# Patient Record
Sex: Male | Born: 1970 | Hispanic: No | Marital: Single | Smoking: Former smoker
Health system: Southern US, Community
[De-identification: ages and names within clinical notes are randomized; demographics above are authoritative.]

## PROBLEM LIST (undated history)

## (undated) DIAGNOSIS — I10 Essential (primary) hypertension: Secondary | ICD-10-CM

## (undated) DIAGNOSIS — M109 Gout, unspecified: Secondary | ICD-10-CM

## (undated) DIAGNOSIS — I861 Scrotal varices: Secondary | ICD-10-CM

## (undated) HISTORY — DX: Gout, unspecified: M10.9

## (undated) HISTORY — DX: Essential (primary) hypertension: I10

---

## 1998-03-15 ENCOUNTER — Encounter: Admission: RE | Admit: 1998-03-15 | Discharge: 1998-06-13 | Payer: Self-pay

## 1998-05-20 ENCOUNTER — Ambulatory Visit (HOSPITAL_COMMUNITY): Admission: RE | Admit: 1998-05-20 | Discharge: 1998-05-20 | Payer: Self-pay | Admitting: *Deleted

## 2004-01-20 ENCOUNTER — Ambulatory Visit (HOSPITAL_BASED_OUTPATIENT_CLINIC_OR_DEPARTMENT_OTHER): Admission: RE | Admit: 2004-01-20 | Discharge: 2004-01-20 | Payer: Self-pay | Admitting: Oral Surgery

## 2004-02-02 ENCOUNTER — Encounter: Admission: RE | Admit: 2004-02-02 | Discharge: 2004-02-02 | Payer: Self-pay | Admitting: Internal Medicine

## 2009-06-05 ENCOUNTER — Emergency Department (HOSPITAL_COMMUNITY): Admission: EM | Admit: 2009-06-05 | Discharge: 2009-06-06 | Payer: Self-pay | Admitting: Internal Medicine

## 2010-05-19 NOTE — Op Note (Signed)
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   PROCEDURE:  Surgical removal of full bony supernumerary #1-S, surgical  extraction of tooth #1, full bony impaction tooth #16, and full bony  impaction tooth #17.   ANESTHESIA:  General.   SURGEON:  Hinton Dyer, D.D.S.   ASSISTANTS:  Angelia Mould and Montel Culver.   ESTIMATED BLOOD LOSS:  Less than 100 mL.   CONDITION AT END OF SURGERY:  Good.   DESCRIPTION OF PROCEDURE:  Following preoperative medication, the patient  was brought and placed in the supine position which he remained throughout  the whole procedure. He was intubated by right nasal endotracheal tube and  prepped and draped in the usual fashion for an enteral procedure. The throat  was suctioned out dry, and a moist open 4 x 4 gauze was placed around the  endotracheal tube. Two percent Xylocaine with 1:100,000 epinephrine was  given as a block on the left side and infiltration around the impacted  wisdom tooth. A second carpule was given in the mucobuccal fold and palate,  and a third carpule was given on the right side in the mucobuccal fold and  palate. Starting in the upper left side, a 15 blade was used to make an  incision over the left tuberosity with a full thickness envelope flap  created around teeth numbers 14 and 15. A child-size bite block was also  used. A periosteal elevator reflected a full thickness flap around the left  tuberosity and the bone covering the  horizontal impaction tooth #16 was now  removed with a round bur and copious irrigation. As the bone was removed  with the round bur and the tooth was exposed, attempts were made to mobilize  it. It was found not to move at all, and bone was continually removed with  the round bur and copious irrigation. Very slowly, the tooth began to be  mobilized with an 11A elevator with great difficulty, and the crown was then  removed using the same round bur. A small purchase point was placed into the  root, and through gentle luxation back and forth, a crane pick was used to  ultimately remove the large bulbous thick roots that were in a horizontal  fashion. The roots of tooth #15 were never seen. The socket was irrigated  out copiously, any bone fragments removed. The area was checked for any  sharp areas, and the soft tissue was repositioned and closed with multiple 3-  0 chromic sutures. A 15 blade was then used to make an incision over the  left  retromolar pad with a distal buckle release on the distal buckle aspect  of tooth #18 and a full thickness mucoperiosteal flap was elevated.  Occlusive buckle and distal bone was removed with a round bur and copious  irrigation, and the tooth was luxated until it finally became mobile and was  removed. Large bulbous roots were again seen. The tooth was extremely  difficult to be removed but ultimately was able to be lifted out using an  11A elevator and a crane pick. The socket was curetted, irrigated and closed  with multiple 3-0 chromic sutures. An abrasion on the lower left lip was  closed with two 4-0 chromic sutures. It was minimal in nature but felt it  would heal better if it were closed primarily. Attention was then turned to  the upper right side. Tooth #1 was erupted, and creating a large ulcer in  the mucobuccal fold, tooth #1-S or supernumerary (#51) was above it. The  tooth was luxated and mobilized with an 11A elevator and finally removed.   The palatal root fractured off and had to be teased out using root picks.  Bone was removed until the crown was removed until the supernumerary could  be visualized, and then it was mobilized with great difficulty with an 11A  elevator. It finally began to move and was removed in a distal buckle  fashion with the 11A elevator. Loose fragments of bone were removed with a  rongeur, and areas were checked for any sharp protruding areas. The area was  irrigated copiously, and multiple 3-0 chromic sutures were used to close the  soft tissues. The throat pack was removed. The 4 x 4 gauze was placed around  the surgical site. Vaseline was placed on the lips. He was extubated on the  table and returned to the recovery room in good condition. He is being given  a prescription for Keflex 500 mg x28 and Percocet x30 one or two q.4h.  p.r.n. pain. He will be followed by me closely in my private office, return  visit one-week observation.       JLM/MEDQ  D:  01/20/2004  T:  01/20/2004  Job:  [id number]

## 2013-12-06 ENCOUNTER — Encounter: Payer: Self-pay | Admitting: *Deleted

## 2014-12-30 ENCOUNTER — Ambulatory Visit
Admission: RE | Admit: 2014-12-30 | Discharge: 2014-12-30 | Disposition: A | Discharge status: Home / Self Care | Payer: Worker's Compensation | Source: Ambulatory Visit | Attending: Nurse Practitioner | Admitting: Nurse Practitioner

## 2014-12-30 ENCOUNTER — Other Ambulatory Visit: Payer: Self-pay | Admitting: Nurse Practitioner

## 2014-12-30 DIAGNOSIS — R52 Pain, unspecified: Secondary | ICD-10-CM

## 2014-12-30 DIAGNOSIS — R609 Edema, unspecified: Secondary | ICD-10-CM

## 2015-05-25 ENCOUNTER — Other Ambulatory Visit: Payer: Self-pay | Admitting: Urology

## 2015-05-25 DIAGNOSIS — I861 Scrotal varices: Secondary | ICD-10-CM

## 2015-06-29 ENCOUNTER — Other Ambulatory Visit: Payer: Self-pay

## 2015-09-01 ENCOUNTER — Other Ambulatory Visit: Payer: Self-pay | Admitting: Urology

## 2015-09-01 DIAGNOSIS — I861 Scrotal varices: Secondary | ICD-10-CM

## 2015-10-12 ENCOUNTER — Ambulatory Visit
Admission: RE | Admit: 2015-10-12 | Discharge: 2015-10-12 | Disposition: A | Discharge status: Home / Self Care | Payer: 59 | Source: Ambulatory Visit | Attending: Urology | Admitting: Urology

## 2015-10-12 DIAGNOSIS — I861 Scrotal varices: Secondary | ICD-10-CM

## 2015-10-12 HISTORY — DX: Scrotal varices: I86.1

## 2015-10-12 HISTORY — PX: IR GENERIC HISTORICAL: IMG1180011

## 2015-11-02 NOTE — Consult Note (Signed)
Chief Complaint: Patient was seen in consultation today for left varicocele at the request of Tannenbaum,Sigmund  Referring Physician(s): Tannenbaum,Sigmund  History of Present Illness: Kenneth Bentley is a 45 y.o. male with a history of left testicular varicocele. He describes an aching pain of the left scrotum that lasts up to several hours. He feels that this discomfort has been worse since vasectomy in January. Discomfort is not constant and not necessarily associated with strenuous activity or exercise.  Past Medical History:  Diagnosis Date  . Hypertension   . Left varicocele     No past surgical history on file.  Allergies: Vicodin [hydrocodone-acetaminophen]  Medications: Prior to Admission medications   Medication Sig Start Date End Date Taking? Authorizing Provider  anastrozole (ARIMIDEX) 1 MG tablet Take 1 mg by mouth daily.   Yes Historical Provider, MD  clomiPHENE (CLOMID) 50 MG tablet Take 50 mg by mouth daily.   Yes Historical Provider, MD  colchicine 0.6 MG tablet Take 0.6 mg by mouth daily. Take 2 tablets x 1, take 1 tablet 1 hour after, every 3 days as needed for gout attacks.   Yes Historical Provider, MD  losartan (COZAAR) 50 MG tablet Take 50 mg by mouth daily.   Yes Historical Provider, MD  Multiple Vitamin (MULTIVITAMIN) tablet Take 1 tablet by mouth daily.   Yes Historical Provider, MD  hydrochlorothiazide (MICROZIDE) 12.5 MG capsule Take 12.5 mg by mouth daily.    Historical Provider, MD     No family history on file.  Social History   Social History  . Marital status: Single    Spouse name: N/A  . Number of children: N/A  . Years of education: N/A   Social History Main Topics  . Smoking status: Former Research scientist (life sciences)  . Smokeless tobacco: Never Used  . Alcohol use Yes     Comment: Occasional beer or glass of wine    . Drug use: No  . Sexual activity: Not on file   Other Topics Concern  . Not on file   Social History Narrative  . No  narrative on file     Review of Systems: A 12 point ROS discussed and pertinent positives are indicated in the HPI above.  All other systems are negative.  Review of Systems  Constitutional: Negative.   Respiratory: Negative.   Cardiovascular: Negative.   Gastrointestinal: Negative.   Genitourinary: Negative.   Musculoskeletal: Negative.   Neurological: Negative.     Vital Signs: BP (!) 141/88 (BP Location: Left Arm, Patient Position: Sitting, Cuff Size: Large)   Pulse 89   Temp 98.1 F (36.7 C) (Oral)   Resp 14   Ht _0  (1.803 m)   Wt 230 lb (104.3 kg)   SpO2 100%   BMI 32.08 kg/m   Physical Exam  Constitutional: He appears well-developed and well-nourished. No distress.  HENT:  Head: Normocephalic and atraumatic.  Neck: Neck supple. No JVD present. No tracheal deviation present. No thyromegaly present.  Cardiovascular: Normal rate, regular rhythm and normal heart sounds.  Exam reveals no friction rub.   No murmur heard. Pulmonary/Chest: Effort normal and breath sounds normal. No stridor. No respiratory distress. He has no wheezes. He has no rales.  Abdominal: Soft. Bowel sounds are normal. He exhibits no distension. There is no tenderness.  Genitourinary:  Genitourinary Comments: Scrotal exam demonstrates a palpable roughly 2+ left varicocele with Valsalva maneuver. The left testis is approximately 20-25 percent smaller in volume compared to the right by  palpation. No palpable right-sided varicocele. No palpable scrotal or testicular masses.  Skin: He is not diaphoretic.  Nursing note and vitals reviewed.    Imaging: No results found.   Assessment and Plan:  I met with Kenneth Bentley. We reviewed options for treatment of symptomatic left varicocele including transcatheter embolization of the testicular vein and surgical ligation. We reviewed a prior scrotal ultrasound performed at [facility] on 05/06/2015 that demonstrates normal testicles bilaterally, trace  hydroceles and suggestion of bilateral varicocele, left greater than right. When reviewing images, the right-sided varicocele does not appear significant and a right-sided varicocele cannot be palpated on physical examination. The left varicocele is palpable.  Details of possible transcatheter embolization of the left testicular vein were discussed. After discussion, since his symptoms are not severe, he would like to think about having a procedure done and will call us to let us know if he would like to pursue embolization.  Thank you for this interesting consult.  I greatly enjoyed meeting Kenneth Bentley and look forward to participating in their care.  A copy of this report was sent to the requesting provider on this date.  Electronically SignedAletta Edouard T 11/02/2015, 2:21 PM     I spent a total of 40 Minutes in face to face in clinical consultation, greater than 50% of which was counseling/coordinating care for left testicular varicocele.

## 2015-11-22 ENCOUNTER — Encounter: Payer: Self-pay | Admitting: Interventional Radiology

## 2016-05-02 DIAGNOSIS — E291 Testicular hypofunction: Secondary | ICD-10-CM | POA: Diagnosis not present

## 2016-05-02 DIAGNOSIS — Z125 Encounter for screening for malignant neoplasm of prostate: Secondary | ICD-10-CM | POA: Diagnosis not present

## 2016-06-28 DIAGNOSIS — L910 Hypertrophic scar: Secondary | ICD-10-CM | POA: Diagnosis not present

## 2016-07-13 DIAGNOSIS — H25013 Cortical age-related cataract, bilateral: Secondary | ICD-10-CM | POA: Diagnosis not present

## 2016-07-13 DIAGNOSIS — H04123 Dry eye syndrome of bilateral lacrimal glands: Secondary | ICD-10-CM | POA: Diagnosis not present

## 2016-07-13 DIAGNOSIS — H40023 Open angle with borderline findings, high risk, bilateral: Secondary | ICD-10-CM | POA: Diagnosis not present

## 2016-11-07 DIAGNOSIS — E291 Testicular hypofunction: Secondary | ICD-10-CM | POA: Diagnosis not present

## 2016-11-19 DIAGNOSIS — E785 Hyperlipidemia, unspecified: Secondary | ICD-10-CM | POA: Diagnosis not present

## 2016-11-19 DIAGNOSIS — E291 Testicular hypofunction: Secondary | ICD-10-CM | POA: Diagnosis not present

## 2016-11-19 DIAGNOSIS — R945 Abnormal results of liver function studies: Secondary | ICD-10-CM | POA: Diagnosis not present

## 2016-11-19 DIAGNOSIS — E559 Vitamin D deficiency, unspecified: Secondary | ICD-10-CM | POA: Diagnosis not present

## 2016-11-19 DIAGNOSIS — I10 Essential (primary) hypertension: Secondary | ICD-10-CM | POA: Diagnosis not present

## 2017-01-15 DIAGNOSIS — H5213 Myopia, bilateral: Secondary | ICD-10-CM | POA: Diagnosis not present

## 2017-01-15 DIAGNOSIS — H52223 Regular astigmatism, bilateral: Secondary | ICD-10-CM | POA: Diagnosis not present

## 2017-02-07 IMAGING — CR DG OS CALCIS 2+V*R*
2 series · 2 of 2 positions shown · non-contrast
Comparison: None.

CLINICAL DATA: Fell yesterday and landed on right heel. Medial heel
pain and swelling.

EXAM:
RIGHT OS CALCIS - 2+ VIEW

[x calcaneus lat right]
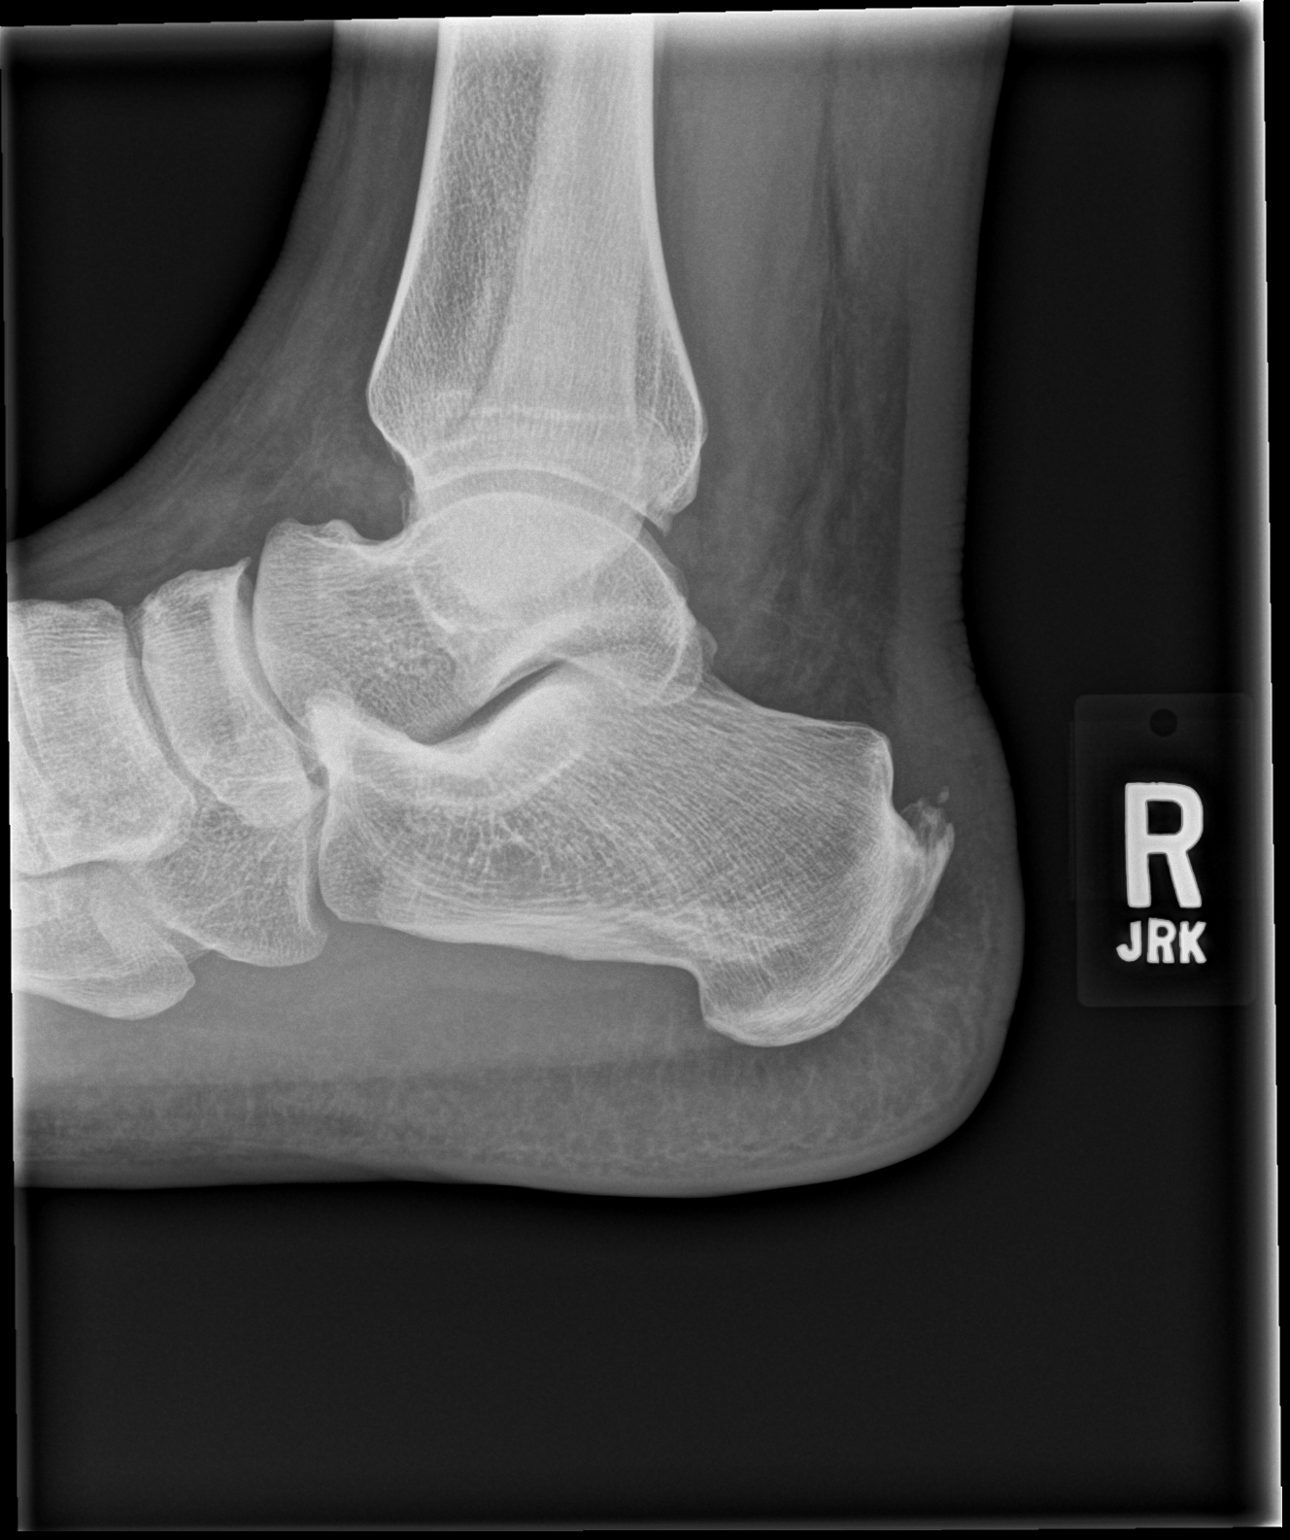

[x calcaneus axial right]
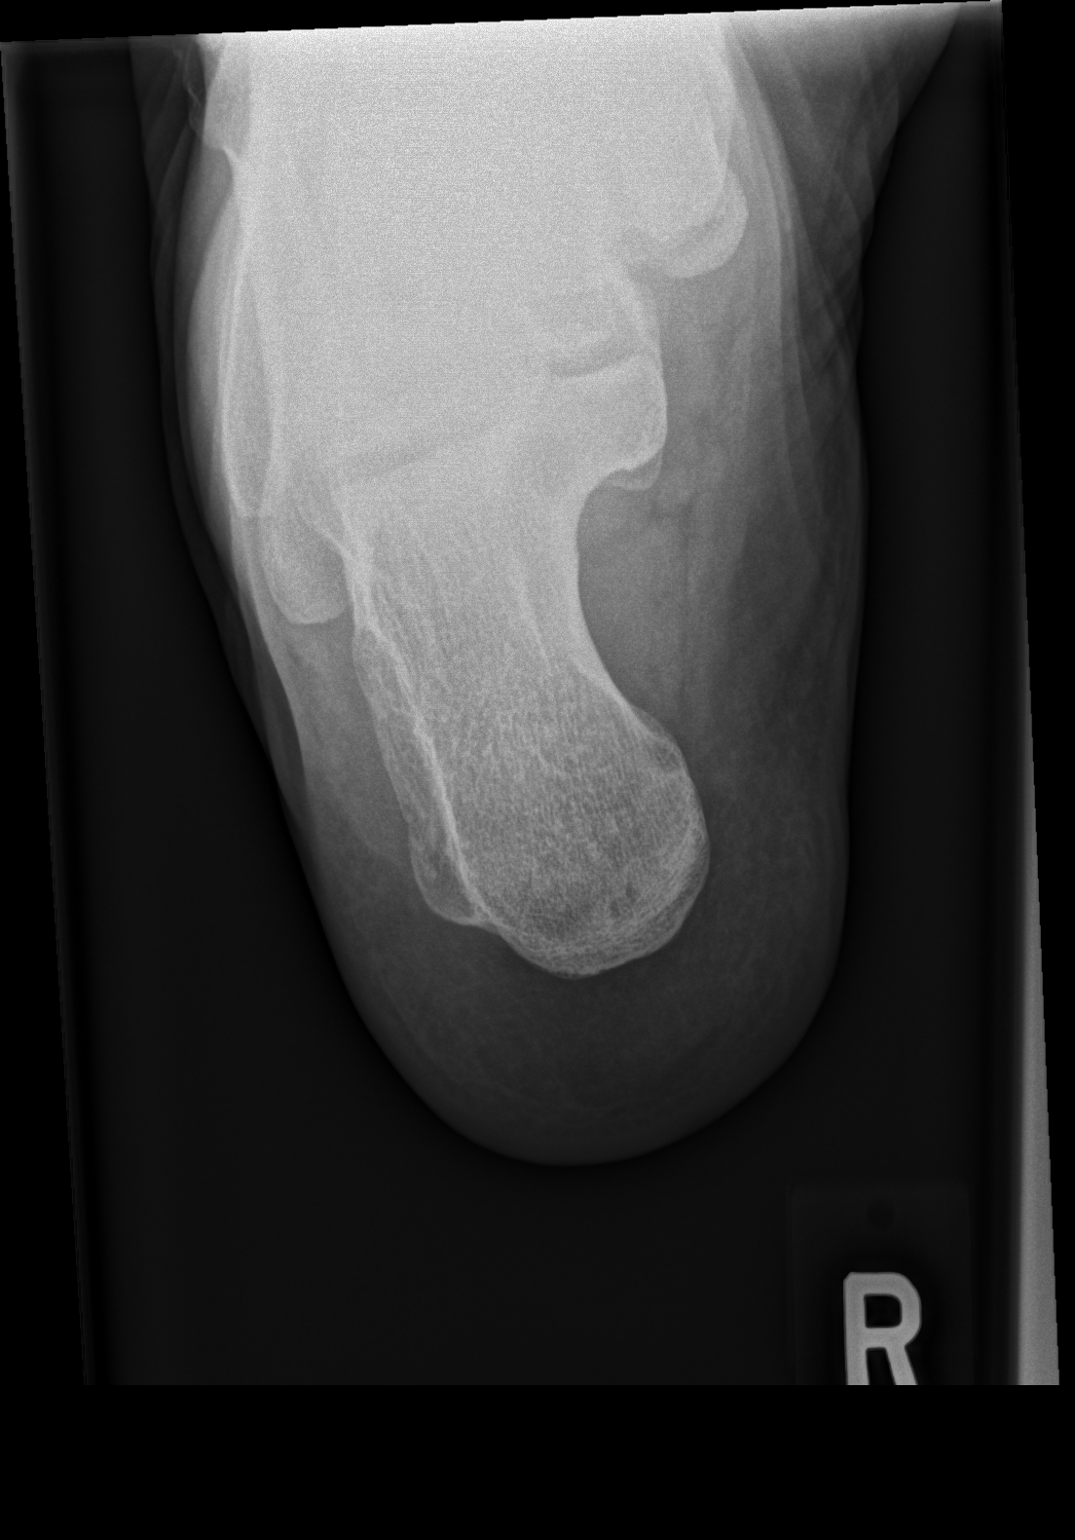

[2 of 2 positions shown; findings below may reference images not displayed]

FINDINGS: The tibiotalar and subtalar joint spaces are maintained. No
calcaneal fractures identified. Calcaneal spurring changes noted
near the Achilles tendon attachment.
IMPRESSION: No acute fracture.

## 2017-09-06 DIAGNOSIS — L910 Hypertrophic scar: Secondary | ICD-10-CM | POA: Diagnosis not present

## 2017-11-01 DIAGNOSIS — M545 Low back pain: Secondary | ICD-10-CM | POA: Diagnosis not present

## 2017-11-01 DIAGNOSIS — E291 Testicular hypofunction: Secondary | ICD-10-CM | POA: Diagnosis not present

## 2017-11-01 DIAGNOSIS — R1031 Right lower quadrant pain: Secondary | ICD-10-CM | POA: Diagnosis not present

## 2018-01-08 DIAGNOSIS — M47819 Spondylosis without myelopathy or radiculopathy, site unspecified: Secondary | ICD-10-CM | POA: Diagnosis not present

## 2018-01-13 DIAGNOSIS — E559 Vitamin D deficiency, unspecified: Secondary | ICD-10-CM | POA: Diagnosis not present

## 2018-01-13 DIAGNOSIS — Z0000 Encounter for general adult medical examination without abnormal findings: Secondary | ICD-10-CM | POA: Diagnosis not present

## 2018-01-13 DIAGNOSIS — E785 Hyperlipidemia, unspecified: Secondary | ICD-10-CM | POA: Diagnosis not present

## 2018-01-22 DIAGNOSIS — M9901 Segmental and somatic dysfunction of cervical region: Secondary | ICD-10-CM | POA: Diagnosis not present

## 2018-01-22 DIAGNOSIS — M9902 Segmental and somatic dysfunction of thoracic region: Secondary | ICD-10-CM | POA: Diagnosis not present

## 2018-01-22 DIAGNOSIS — M531 Cervicobrachial syndrome: Secondary | ICD-10-CM | POA: Diagnosis not present

## 2019-02-18 NOTE — Progress Notes (Signed)
Virtual Visit via Telephone Note   This visit type was conducted due to national recommendations for restrictions regarding the COVID-19 Pandemic (e.g. social distancing) in an effort to limit this patient's exposure and mitigate transmission in our community.  Due to his co-morbid illnesses, this patient is at least at moderate risk for complications without adequate follow up.  This format is felt to be most appropriate for this patient at this time.  The patient did not have access to video technology/had technical difficulties with video requiring transitioning to audio format only (telephone).  All issues noted in this document were discussed and addressed.  No physical exam could be performed with this format.  Please refer to the patient's chart for his  consent to telehealth for [facility].   Date:  02/19/2019   ID:  Kenneth Bentley, DOB March 15, 1970, MRN [id number]  Patient Location: Home Provider Location: Home  PCP:  Kenneth Has, MD   Evaluation Performed:  New patient visit   Chief Complaint:  Shortness of breath/palpitations   History of Present Illness:    Kenneth Bentley is a 49 y.o. male with history of hypertension who presents for the evaluation of shortness of breath at the request of Kenneth Has, MD. For the past 5 months, he gets a bit short of breath with heavy exertion. Also gets palpitations. Symptoms occur 2-3 days per week. Symptoms of SOB/palpitations occur together. After heavy exertion they resolve within 10-15 minutes with cessation of activity.  He also reports occasional chest tightness with his symptoms of palpitations and shortness of breath.  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  He reports a family situation which is resulted in him to be less active.  He usually maintains a regular aerobic exercise regimen, but Bentley not done so for the past few months.  His symptoms appear to be bothersome.  He reports he wants to make sure his  heart is okay.  He does have concerns for heart arrhythmia.  He does have a family history of pacemaker in a maternal grandmother.  No strong family history of cardiovascular disease.  He is not diabetic.  He reports he does have a history of hypertension.  His blood pressures have been a little higher lately and he checks them at the station.  They have run in the 140/90 range.  He describes no symptoms of heart failure including orthopnea, PND, lower extremity edema.  He reports regular activity such as walking does not get him out of breath or resultant palpitations.  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  He is due for his annual physical and this will happen in the next few weeks.  He would not like to repeat laboratory data that will be obtained by them.   The patient does not have symptoms concerning for COVID-19 infection (fever, chills, cough, or new shortness of breath).   Past Medical History:  Diagnosis Date  . Gout   . Hypertension   . Left varicocele    Past Surgical History:  Procedure Laterality Date  . IR GENERIC HISTORICAL  10/12/2015   IR RADIOLOGIST EVAL & MGMT 10/12/2015 Irish Lack, MD [facility] INTERV RAD  . WISDOM TOOTH EXTRACTION       Current Meds  Medication Sig  . anastrozole (ARIMIDEX) 1 MG tablet Take 1 mg by mouth daily.  . clomiPHENE (CLOMID) 50 MG tablet Take 50 mg by mouth daily.  Marland Kitchen  colchicine 0.6 MG tablet Take 0.6 mg by mouth daily. Take 2 tablets x 1, take 1 tablet 1 hour after, every 3 days as needed for gout attacks.  . hydrochlorothiazide (MICROZIDE) 12.5 MG capsule Take 12.5 mg by mouth daily.  Marland Kitchen losartan (COZAAR) 50 MG tablet Take 50 mg by mouth daily.  . Multiple Vitamin (MULTIVITAMIN) tablet Take 1 tablet by mouth daily.     Allergies:   Vicodin [hydrocodone-acetaminophen]   Social History   Tobacco Use  . Smoking status: Former Smoker    Years: 5.00    Types: Cigarettes  . Smokeless tobacco: Never  Used  Substance Use Topics  . Alcohol use: Yes    Comment: Occasional beer or glass of wine    . Drug use: No     Family Hx: The patient's family history includes Arrhythmia in his paternal grandmother; Arthritis in his mother.  ROS:   Please see the history of present illness.     All other systems reviewed and are negative.   Prior CV studies:   The following studies were reviewed today:  None  Labs/Other Tests and Data Reviewed:    EKG:  No ECG reviewed.  Recent Labs: No results found for requested labs within last 8760 hours.   Recent Lipid Panel No results found for: CHOL, TRIG, HDL, CHOLHDL, LDLCALC, LDLDIRECT  Wt Readings from Last 3 Encounters:  10/12/15 230 lb (104.3 kg)     Objective:    Vital Signs:  There were no vitals taken for this visit.   VITAL SIGNS:  reviewed  General: No acute distress Pulmonary: Talking in full sentences, no shortness of breath on our phone interview Psych: Normal mood and affect  ASSESSMENT & PLAN:    1. SOB (shortness of breath) on exertion 2. Palpitations 3. Chest pain of uncertain etiology -4 to 5 months of worsening exertional shortness of breath and palpitations.  Also can get chest tightness.  Symptoms resolve with cessation of activity.  This is a virtual visit I have no EKG.  I am unable to perform a cardiovascular examination.  Differential could include heart failure versus arrhythmia.  His chest tightness is atypical and right now I have a low suspicion for obstructive CAD.  This could change if I had an EKG.  We will go ahead and get the ball rolling with TSH, BNP, echocardiogram.  We will also obtain an EKG at the time of his blood work and echocardiogram.  I will also go ahead and order a 7-day ZIO patch to exclude any significant arrhythmia.  Should his symptoms worsen or he be concerned, he will see [place] sooner.  We will plan to see him in our office in 2 months.  Overall, this is extremely difficult to do a full  assessment over the phone.  I did express his concern to him.  He reports that he would like to go ahead and get this work-up started and then see [place] in the office in a few months.  4. Essential hypertension -No change to current medications.   COVID-19 Education: The signs and symptoms of COVID-19 were discussed with the patient and how to seek care for testing (follow up with PCP or arrange E-visit). The importance of social distancing was discussed today.  Time:   Today, I have spent 35 minutes with the patient with telehealth technology discussing the above problems.     Medication Adjustments/Labs and Tests Ordered: Current medicines are reviewed at length with the patient  today.  Concerns regarding medicines are outlined above.   Tests Ordered: Orders Placed This Encounter  Procedures  . Brain natriuretic peptide  . TSH  . LONG TERM MONITOR (3-14 DAYS)  . ECHOCARDIOGRAM COMPLETE    Medication Changes: No orders of the defined types were placed in this encounter.   Follow Up:  In Person in 2 month(s)  Signed, Evalina Field, MD  02/19/2019 9:42 AM    [place]

## 2019-02-19 ENCOUNTER — Telehealth (INDEPENDENT_AMBULATORY_CARE_PROVIDER_SITE_OTHER): Payer: 59 | Admitting: Cardiovascular Disease

## 2019-02-19 ENCOUNTER — Encounter: Payer: Self-pay | Admitting: Cardiovascular Disease

## 2019-02-19 ENCOUNTER — Telehealth: Payer: Self-pay | Admitting: *Deleted

## 2019-02-19 DIAGNOSIS — R079 Chest pain, unspecified: Secondary | ICD-10-CM

## 2019-02-19 DIAGNOSIS — R0602 Shortness of breath: Secondary | ICD-10-CM

## 2019-02-19 DIAGNOSIS — I10 Essential (primary) hypertension: Secondary | ICD-10-CM

## 2019-02-19 DIAGNOSIS — M109 Gout, unspecified: Secondary | ICD-10-CM | POA: Insufficient documentation

## 2019-02-19 DIAGNOSIS — R002 Palpitations: Secondary | ICD-10-CM

## 2019-02-19 NOTE — Telephone Encounter (Signed)
Patient enrolled for [employer] to mail a 7 day ZIO XT long term holter monitor to his home.  Instructions reviewed briefly as they are included in the monitor kit.

## 2019-02-19 NOTE — Patient Instructions (Signed)
Medication Instructions:  The current medical regimen is effective;  continue present plan and medications.  *If you need a refill on your cardiac medications before your next appointment, please call your pharmacy*  Lab Work: BNP, TSH - the day of ECHO.  Please call [address] [facility] to get an appointment.  If you have labs (blood work) drawn today and your tests are completely normal, you will receive your results only by: Marland Kitchen MyChart Message (if you have MyChart) OR . A paper copy in the mail If you have any lab test that is abnormal or we need to change your treatment, we will call you to review the results.  Testing/Procedures: Echocardiogram - Your physician has requested that you have an echocardiogram. Echocardiography is a painless test that uses sound waves to create images of your heart. It provides your doctor with information about the size and shape of your heart and how well your heart's chambers and valves are working. This procedure takes approximately one hour. There are no restrictions for this procedure. This will be performed at our [facility] location - [address].  Your physician has recommended that you wear a 7 DAY ZIO-PATCH monitor. The Zio patch cardiac monitor continuously records heart rhythm data for up to 14 days, this is for patients being evaluated for multiple types heart rhythms. For the first 24 hours post application, please avoid getting the Zio monitor wet in the shower or by excessive sweating during exercise. After that, feel free to carry on with regular activities. Keep soaps and lotions away from the ZIO XT Patch.  This will be mailed to you, please expect 7-10 days to receive.        Follow-Up: At [facility], you and your health needs are our priority.  As part of our continuing mission to provide you with exceptional heart care, we have created designated Provider Care Teams.  These Care Teams include your primary Cardiologist  (physician) and Advanced Practice Providers (APPs -  Physician Assistants and Nurse Practitioners) who all work together to provide you with the care you need, when you need it.  Your next appointment:   2 month(s)  The format for your next appointment:   In Person  Provider:   Lennie Odor, MD  Other Instructions Also have EKG completed at [facility] street office when you go for your ECHO.  They will schedule this as well.

## 2019-02-20 ENCOUNTER — Telehealth: Payer: Self-pay | Admitting: Cardiovascular Disease

## 2019-02-20 NOTE — Telephone Encounter (Signed)
Left message for patient to call and schedule EKG, Echo and lab work at our [facility] per Dr. Flora Lipps

## 2019-02-21 ENCOUNTER — Other Ambulatory Visit: Payer: Self-pay

## 2019-02-21 ENCOUNTER — Encounter (HOSPITAL_COMMUNITY): Payer: Self-pay

## 2019-02-21 ENCOUNTER — Ambulatory Visit (HOSPITAL_COMMUNITY)
Admission: EM | Admit: 2019-02-21 | Discharge: 2019-02-21 | Disposition: A | Discharge status: Home / Self Care | Payer: 59 | Attending: Emergency Medicine | Admitting: Emergency Medicine

## 2019-02-21 ENCOUNTER — Ambulatory Visit (INDEPENDENT_AMBULATORY_CARE_PROVIDER_SITE_OTHER): Payer: 59

## 2019-02-21 ENCOUNTER — Ambulatory Visit (HOSPITAL_COMMUNITY): Payer: 59

## 2019-02-21 DIAGNOSIS — S8991XA Unspecified injury of right lower leg, initial encounter: Secondary | ICD-10-CM

## 2019-02-21 DIAGNOSIS — M25561 Pain in right knee: Secondary | ICD-10-CM

## 2019-02-21 DIAGNOSIS — Z042 Encounter for examination and observation following work accident: Secondary | ICD-10-CM

## 2019-02-21 MED ORDER — IBUPROFEN 800 MG PO TABS: 800.0000 mg | ORAL_TABLET | Freq: Three times a day (TID) | ORAL | 0 refills | Status: AC

## 2019-02-21 NOTE — Discharge Instructions (Signed)
No fracture I am concerned about possible meniscus/MCL damage Please follow-up with orthopedics or sports medicine for further imaging of your knee  Use crutches and knee brace Use anti-inflammatories for pain/swelling. You may take up to 800 mg Ibuprofen every 8 hours with food. You may supplement Ibuprofen with Tylenol [phone] mg every 8 hours.  Ice and elevate knee

## 2019-02-21 NOTE — ED Provider Notes (Signed)
[facility]    CSN: [id number] Arrival date & time: 02/21/19  1509      History   Chief Complaint Chief Complaint  Patient presents with  . Knee Injury    HPI Kenneth Bentley is a 49 y.o. male history of hypertension presenting today for evaluation of right knee injury.  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  He had a sharp shooting pain in the knee with this and has had a lot of pain and subsequent stiffness since.  Reports increased pain with extreme flexion and extension of knee.  He took some ibuprofen and has been trying to ice it.  He denies prior knee injuries.  HPI  Past Medical History:  Diagnosis Date  . Gout   . Hypertension   . Left varicocele     Patient Active Problem List   Diagnosis Date Noted  . HTN (hypertension) 02/19/2019  . Gout 02/19/2019    Past Surgical History:  Procedure Laterality Date  . IR GENERIC HISTORICAL  10/12/2015   IR RADIOLOGIST EVAL & MGMT 10/12/2015 Irish Lack, MD [facility] INTERV RAD  . WISDOM TOOTH EXTRACTION         Home Medications    Prior to Admission medications   Medication Sig Start Date End Date Taking? Authorizing Provider  anastrozole (ARIMIDEX) 1 MG tablet Take 1 mg by mouth daily.    [provider]  clomiPHENE (CLOMID) 50 MG tablet Take 50 mg by mouth daily.    [provider]  colchicine 0.6 MG tablet Take 0.6 mg by mouth daily. Take 2 tablets x 1, take 1 tablet 1 hour after, every 3 days as needed for gout attacks.    [provider]  hydrochlorothiazide (MICROZIDE) 12.5 MG capsule Take 12.5 mg by mouth daily.    [provider]  ibuprofen (ADVIL) 800 MG tablet Take 1 tablet (800 mg total) by mouth 3 (three) times daily. 02/21/19   Mylei Brackeen C, PA-C  losartan (COZAAR) 50 MG tablet Take 50 mg by mouth daily.    [provider]  Multiple Vitamin (MULTIVITAMIN) tablet Take 1 tablet  by mouth daily.    [provider]    Family History Family History  Problem Relation Age of Onset  . Arthritis Mother   . Arrhythmia Paternal Grandmother     Social History Social History   Tobacco Use  . Smoking status: Former Smoker    Years: 5.00    Types: Cigarettes  . Smokeless tobacco: Never Used  Substance Use Topics  . Alcohol use: Yes    Comment: Occasional beer or glass of wine    . Drug use: No     Allergies   Peanut-containing drug products and Vicodin [hydrocodone-acetaminophen]   Review of Systems Review of Systems  Constitutional: Negative for fatigue and fever.  Eyes: Negative for redness, itching and visual disturbance.  Respiratory: Negative for shortness of breath.   Cardiovascular: Negative for chest pain and leg swelling.  Gastrointestinal: Negative for nausea and vomiting.  Musculoskeletal: Positive for arthralgias and joint swelling. Negative for myalgias.  Skin: Negative for color change, rash and wound.  Neurological: Negative for dizziness, syncope, weakness, light-headedness and headaches.     Physical Exam Triage Vital Signs ED Triage Vitals  Enc Vitals Group     BP 02/21/19 1539 (!) 156/94     Pulse Rate 02/21/19 1539 74  Resp 02/21/19 1539 20     Temp 02/21/19 1539 98.4 F (36.9 C)     Temp Source 02/21/19 1539 Oral     SpO2 02/21/19 1539 97 %     Weight --      Height --      Head Circumference --      Peak Flow --      Pain Score 02/21/19 1540 6     Pain Loc --      Pain Edu? --      Excl. in [place]? --    No data found.  Updated Vital Signs BP (!) 156/94 (BP Location: Right Arm)   Pulse 74   Temp 98.4 F (36.9 C) (Oral)   Resp 20   SpO2 97%   Visual Acuity Right Eye Distance:   Left Eye Distance:   Bilateral Distance:    Right Eye Near:   Left Eye Near:    Bilateral Near:     Physical Exam Vitals and nursing note reviewed.  Constitutional:      Appearance: He is well-developed.     Comments:  No acute distress  HENT:     Head: Normocephalic and atraumatic.     Nose: Nose normal.  Eyes:     Conjunctiva/sclera: Conjunctivae normal.  Cardiovascular:     Rate and Rhythm: Normal rate.  Pulmonary:     Effort: Pulmonary effort is normal. No respiratory distress.  Abdominal:     General: There is no distension.  Musculoskeletal:        General: Normal range of motion.     Cervical back: Neck supple.     Comments: Right knee: No discoloration erythema or warmth, moderate swelling compared to left; nontender to palpation over patella, tender to palpation of medial joint line, nontender to palpation of popliteal area, no laxity appreciated with varus and valgus stress, but does have increased pain with valgus stressing on medial aspect, negative Lachman's, negative McMurray's  Skin:    General: Skin is warm and dry.  Neurological:     Mental Status: He is alert and oriented to person, place, and time.      UC Treatments / Results  Labs (all labs ordered are listed, but only abnormal results are displayed) Labs Reviewed - No data to display  EKG   Radiology DG Knee Complete 4 Views Right  Result Date: 02/21/2019 CLINICAL DATA:  Onset medial right knee pain today after stepping off an elevated surface. Limited range of motion and difficulty weight-bearing. Initial encounter. EXAM: RIGHT KNEE - COMPLETE 4+ VIEW COMPARISON:  None. FINDINGS: No evidence of fracture, dislocation, or joint effusion. No evidence of arthropathy or other focal bone abnormality. Soft tissues are unremarkable. IMPRESSION: Negative exam. Electronically Signed   By: Inge Rise M.D.   On: 02/21/2019 16:19    Procedures Procedures (including critical care time)  Medications Ordered in UC Medications - No data to display  Initial Impression / Assessment and Plan / UC Course  I have reviewed the triage vital signs and the nursing notes.  Pertinent labs & imaging results that were available during  my care of the patient were reviewed by me and considered in my medical decision making (see chart for details).    X-ray negative for acute bony abnormality. Exam and history concerning for possible ligamentous versus meniscal injury.  May have MCL straining given discomfort with valgus stressing.  Recommending follow-up with sports medicine/orthopedics as likely needs MRI.  Placing on crutches  for comfort and applying hinged knee brace.  Tylenol and ibuprofen for pain and swelling, ice and elevate.  Discussed strict return precautions. Patient verbalized understanding and is agreeable with plan.  Final Clinical Impressions(s) / UC Diagnoses   Final diagnoses:  Acute pain of right knee  Injury of right knee, initial encounter     Discharge Instructions     No fracture I am concerned about possible meniscus/MCL damage Please follow-up with orthopedics or sports medicine for further imaging of your knee  Use crutches and knee brace Use anti-inflammatories for pain/swelling. You may take up to 800 mg Ibuprofen every 8 hours with food. You may supplement Ibuprofen with Tylenol [phone] mg every 8 hours.  Ice and elevate knee      ED Prescriptions    Medication Sig Dispense Auth. Provider   ibuprofen (ADVIL) 800 MG tablet Take 1 tablet (800 mg total) by mouth 3 (three) times daily. 21 tablet Safaa Stingley, [place] C, PA-C     PDMP not reviewed this encounter.   Lew Dawes, [place] 02/21/19 1720

## 2019-02-21 NOTE — ED Triage Notes (Signed)
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

## 2019-02-27 ENCOUNTER — Other Ambulatory Visit: Payer: 59 | Admitting: *Deleted

## 2019-02-27 ENCOUNTER — Ambulatory Visit (INDEPENDENT_AMBULATORY_CARE_PROVIDER_SITE_OTHER): Payer: 59 | Admitting: *Deleted

## 2019-02-27 ENCOUNTER — Ambulatory Visit (HOSPITAL_COMMUNITY): Payer: 59 | Attending: Internal Medicine

## 2019-02-27 ENCOUNTER — Other Ambulatory Visit: Payer: Self-pay

## 2019-02-27 VITALS — HR 62 | Wt 240.4 lb

## 2019-02-27 DIAGNOSIS — R002 Palpitations: Secondary | ICD-10-CM

## 2019-02-27 DIAGNOSIS — R0602 Shortness of breath: Secondary | ICD-10-CM | POA: Insufficient documentation

## 2019-02-27 NOTE — Telephone Encounter (Signed)
Pt phoned stating that he had called [facility] per suggestion from [facility] after being seen here and ortho service informed pt that he would need a referral from a medical doctor sent to them directly. Pt asked if he could obtain that or if calling one of the other ortho services mentioned at his d/c from [facility] would accept his referral for appt and MRI. Advised pt he could call [facility] that was included as one of the potential referral providers and ask if they need a referral from an MD of urgent care or if they would make appt and schedule MRI w/o actual referral from [place]. Also advised pt that after speaking with [facility], if he needed anything further from Urgent Care to call us back if unable to obtain appt/MRI and we would be happy to follow up with any additional provision.  Pt verbalized understanding

## 2019-02-27 NOTE — Patient Instructions (Addendum)
1.  Reason for visit: EKG  2.  Name of MD requesting visit:  ONeal  3. H&P:  SOB, palpitations  4.  ROS related to problem:  n/a  5.  Assessment and plan per MD:  EKG performed showing NSR, HR 62, PR , QRS 92ms, QT/QTc  402/419ms. No ordered received.

## 2019-02-28 LAB — TSH: TSH: 1.14 u[IU]/mL (ref 0.450–4.500)

## 2019-02-28 LAB — BRAIN NATRIURETIC PEPTIDE: BNP: 5.7 pg/mL (ref 0.0–100.0)

## 2019-03-02 ENCOUNTER — Encounter: Payer: Self-pay | Admitting: Family Medicine

## 2019-03-02 ENCOUNTER — Ambulatory Visit (INDEPENDENT_AMBULATORY_CARE_PROVIDER_SITE_OTHER): Payer: 59 | Admitting: Family Medicine

## 2019-03-02 ENCOUNTER — Ambulatory Visit: Payer: 59

## 2019-03-02 ENCOUNTER — Other Ambulatory Visit: Payer: Self-pay

## 2019-03-02 VITALS — BP 146/92 | Ht 71.0 in | Wt 238.0 lb

## 2019-03-02 DIAGNOSIS — M25569 Pain in unspecified knee: Secondary | ICD-10-CM | POA: Insufficient documentation

## 2019-03-02 DIAGNOSIS — M25561 Pain in right knee: Secondary | ICD-10-CM | POA: Diagnosis not present

## 2019-03-02 NOTE — Patient Instructions (Signed)
I'm worried you tore your ACL, MCL, and medial meniscus. We will go ahead with an MRI to further assess. Out of work in the meantime. Icing 15 minutes at a time 3-4 times a day. Wear the brace when up and walking around. Aleve 2 tabs twice a day with food for pain and inflammation. Ok to take tylenol with this if needed. I will call you with your MRI results and next steps.

## 2019-03-02 NOTE — Progress Notes (Signed)
PCP: Farris Has, MD  Subjective:   HPI: Patient is a 49 y.o. male here for right knee pain.  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 Later in the day, he slipped on ice and reported worsening of symptoms. He went to urgent care for the pain and they reported negative x-ray findings but were not able to perform further evaluation. They have since given him a brace although it doesn't fit well.   In the week following initial injury pain has decreased however patient endorses pain at medial leg, popping sensation, stiffness w/ inability to fully extend leg, instability when trying to turn. Pain is aggravated by weight bearing, pivoting, and lying on right side.   Evaluation to date: plain films: normal. Treatment to date: brace which is somewhat effective, and makes knee feel more stable. Given large brace at Urgent Care, but switched to own personal knee brace.Marland Kitchen Has been trying Ibuprofen   Past Medical History:  Diagnosis Date  . Gout   . Hypertension   . Left varicocele     Current Outpatient Medications on File Prior to Visit  Medication Sig Dispense Refill  . hydrochlorothiazide (MICROZIDE) 12.5 MG capsule Take 12.5 mg by mouth daily.    . Multiple Vitamin (MULTIVITAMIN) tablet Take 1 tablet by mouth daily.    Marland Kitchen anastrozole (ARIMIDEX) 1 MG tablet Take 1 mg by mouth daily.    . clomiPHENE (CLOMID) 50 MG tablet Take 50 mg by mouth daily.    . colchicine 0.6 MG tablet Take 0.6 mg by mouth daily. Take 2 tablets x 1, take 1 tablet 1 hour after, every 3 days as needed for gout attacks.    Marland Kitchen ibuprofen (ADVIL) 800 MG tablet Take 1 tablet (800 mg total) by mouth 3 (three) times daily. 21 tablet 0  . losartan (COZAAR) 50 MG tablet Take 50 mg by mouth daily.     No current facility-administered medications on file prior to visit.    Past Surgical History:  Procedure Laterality  Date  . IR GENERIC HISTORICAL  10/12/2015   IR RADIOLOGIST EVAL & MGMT 10/12/2015 Irish Lack, MD [facility] INTERV RAD  . WISDOM TOOTH EXTRACTION      Allergies  Allergen Reactions  . Peanut-Containing Drug Products   . Vicodin [Hydrocodone-Acetaminophen]     Social History   Socioeconomic History  . Marital status: Single    Spouse name: Not on file  . Number of children: Not on file  . Years of education: Not on file  . Highest education level: Not on file  Occupational History  . Not on file  Tobacco Use  . Smoking status: Former Smoker    Years: 5.00    Types: Cigarettes  . Smokeless tobacco: Never Used  Substance and Sexual Activity  . Alcohol use: Yes    Comment: Occasional beer or glass of wine    . Drug use: No  . Sexual activity: Not on file  Other Topics Concern  . Not on file  Social History Narrative   Married with kids   Social Determinants of Health   Financial Resource Strain:   . Difficulty of Paying Living Expenses: Not on file  Food Insecurity:   . Worried About Programme researcher, broadcasting/film/video in the Last Year: Not on file  . Ran Out of Food in the Last Year: Not on file  Transportation Needs:   .  Lack of Transportation (Medical): Not on file  . Lack of Transportation (Non-Medical): Not on file  Physical Activity:   . Days of Exercise per Week: Not on file  . Minutes of Exercise per Session: Not on file  Stress:   . Feeling of Stress : Not on file  Social Connections:   . Frequency of Communication with Friends and Family: Not on file  . Frequency of Social Gatherings with Friends and Family: Not on file  . Attends Religious Services: Not on file  . Active Member of Clubs or Organizations: Not on file  . Attends Archivist Meetings: Not on file  . Marital Status: Not on file  Intimate Partner Violence:   . Fear of Current or Ex-Partner: Not on file  . Emotionally Abused: Not on file  . Physically Abused: Not on file  . Sexually Abused:  Not on file    Family History  Problem Relation Age of Onset  . Arthritis Mother   . Arrhythmia Paternal Grandmother     BP (!) 146/92   Ht 5\' 11"  (1.803 m)   Wt 238 lb (108 kg)   BMI 33.19 kg/m   Review of Systems: See HPI above.     Objective:  Physical Exam:  Gen: NAD, comfortable in exam room MSK --LLE: no swelling/bruising/trauma on inspection, no joint line tenderness, no pain on palpation, full active/passive ROM, normal anterior drawer/Lachman/McMurray's --RLE: no swelling/bruising/trauma on inspection.  Medial joint line tenderness, mild tenderness medial patellar facet.  No other tenderness.  Near full active/passive extension to 0 degrees although patient endorses sense of fullness within joint, Flexion 120 degrees.  Mild laxity with valgus stress at 30 degrees, no laxity with full extension.  Normal varus stress test, Positive Lachman test, positive anterior drawer sign, positive McMurray's, positive apleys.  NVI distally.   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 Pain has been well-controlled with NSAIDs. Patient has been using personal brace however continues to endorse instability. Hinged knee brace provided.  Will perform MRI to confirm findings and evaluate need for surgery.  Out of work in meantime.  Icing, aleve.  Carolyne Littles MS4

## 2019-03-02 NOTE — Assessment & Plan Note (Signed)
  Knee Pain: Patient presents with a knee injury involving the  right knee. Onset of the symptoms was a week ago. Inciting event: At work, had right foot grounded and turned to right side and heard a pop. Later in the day, slipped on ice and patient reported worsening. Had joint swelling that night, but then resolved. Current symptoms include pain located medially, popping sensation and stiffness. Pain is aggravated by any weight bearing, pivoting and lying on right side (feels pressure in the medial aspect of right knee). Patient has had no prior knee problems. Evaluation to date: plain films: normal. Treatment to date: brace which is somewhat effective, and makes knee feel more stable. Given large brace at Urgent Care, but switched to his own knee brace.Marland Kitchen Has been trying Ibuprofen.

## 2019-03-11 ENCOUNTER — Encounter: Payer: Self-pay | Admitting: Family Medicine

## 2021-04-01 IMAGING — DX DG KNEE COMPLETE 4+V*R*
4 series · 4 of 4 positions shown · non-contrast
Comparison: None.

CLINICAL DATA: Onset medial right knee pain today after stepping
off an elevated surface. Limited range of motion and difficulty
weight-bearing. Initial encounter.

EXAM:
RIGHT KNEE - COMPLETE 4+ VIEW

[knee ap]
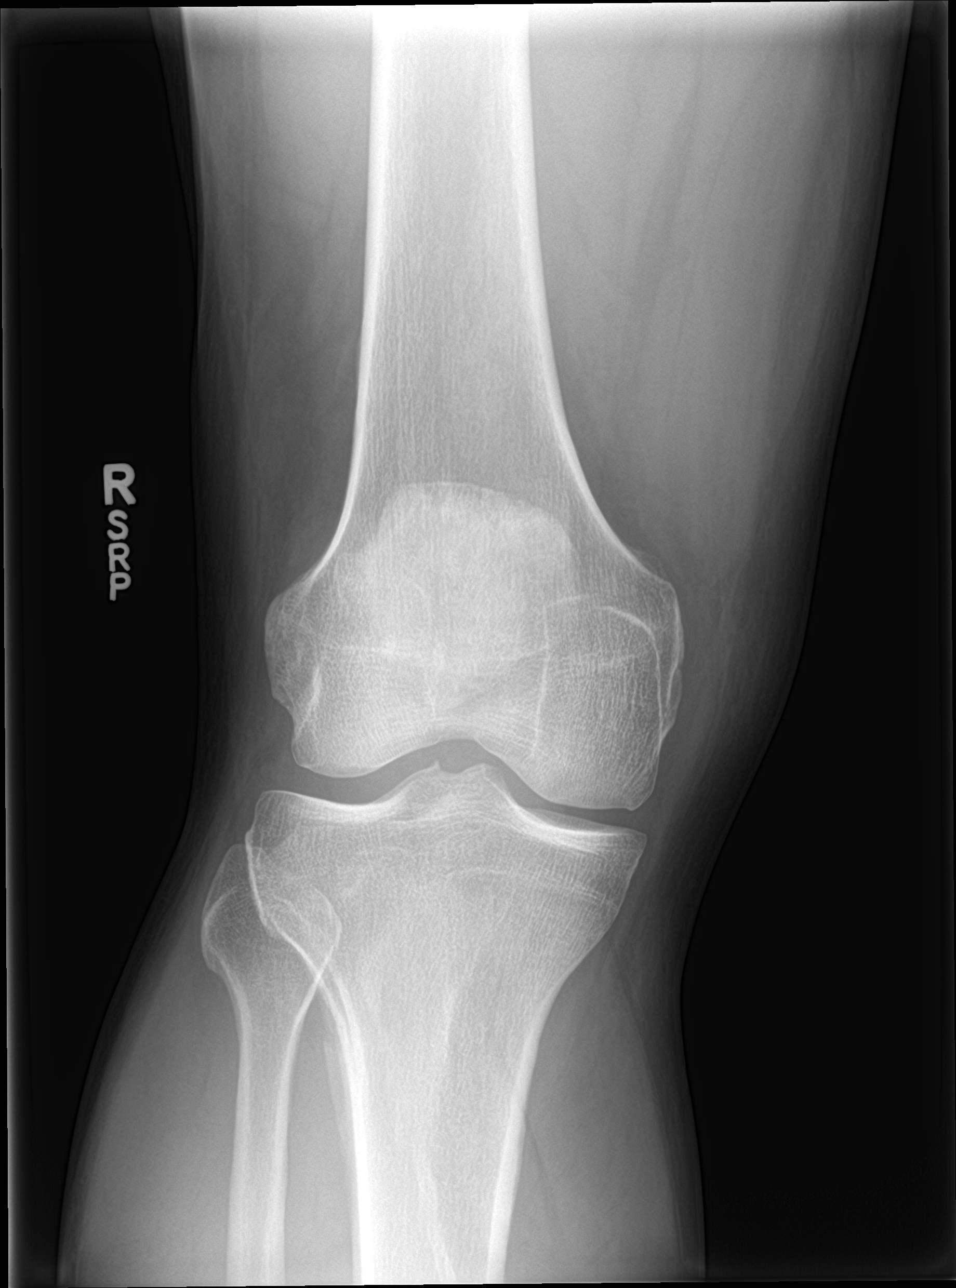

[knee obl (1 of 2)]
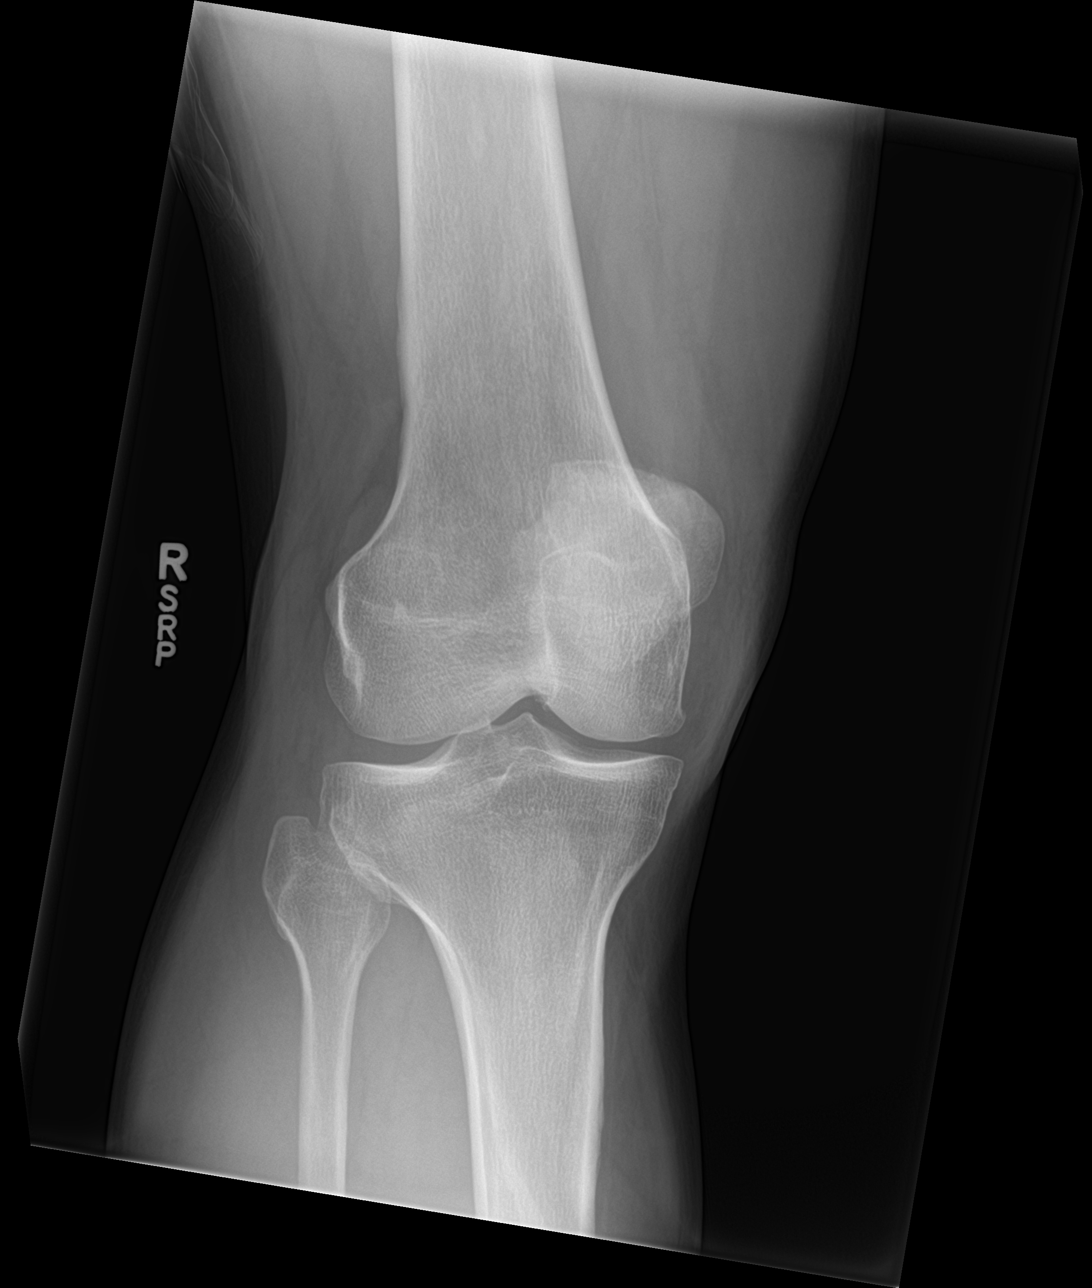

[knee obl (2 of 2)]
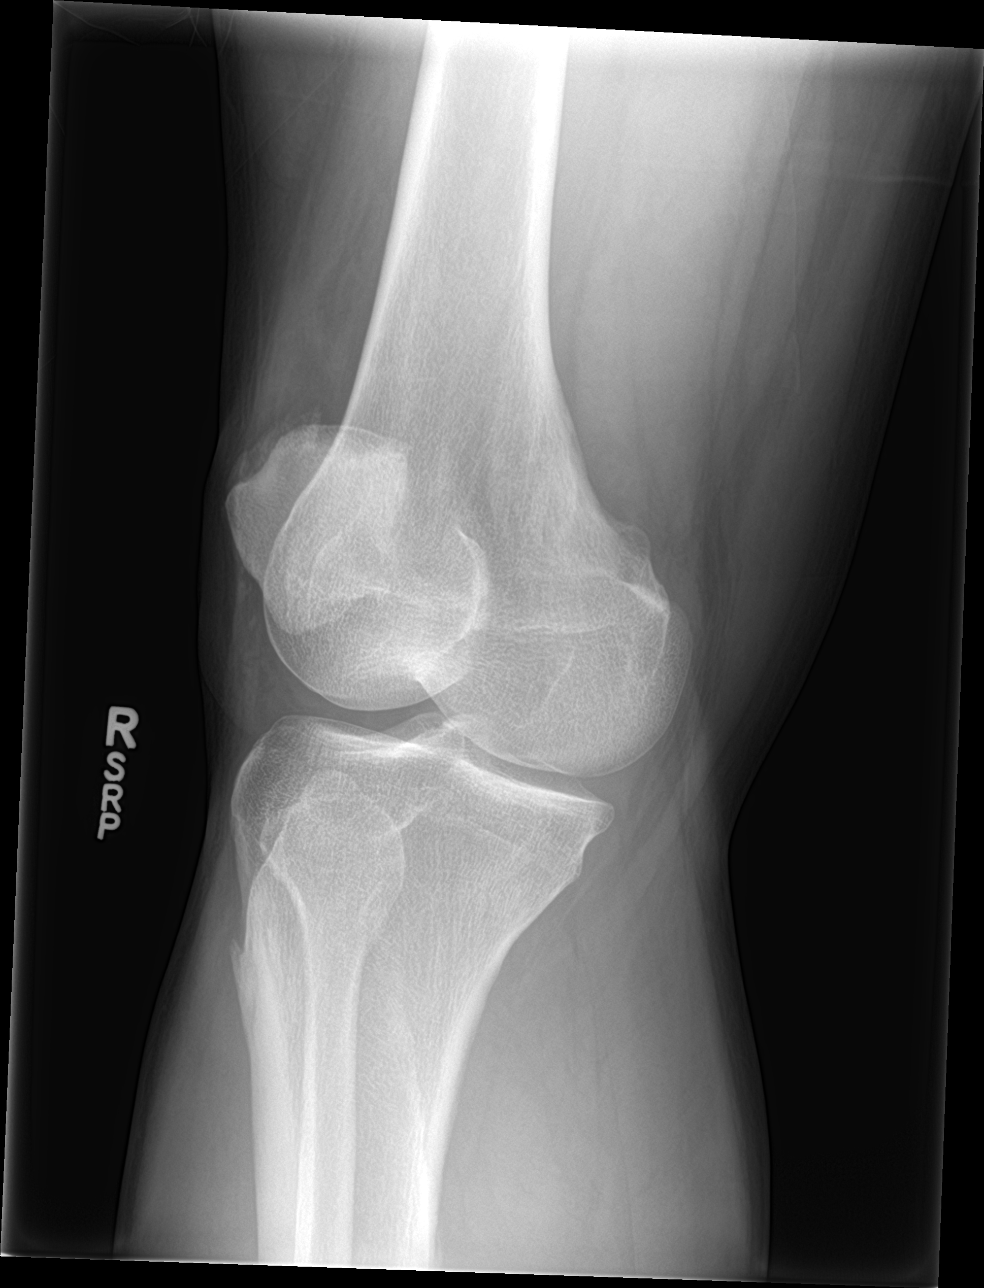

[knee lat]
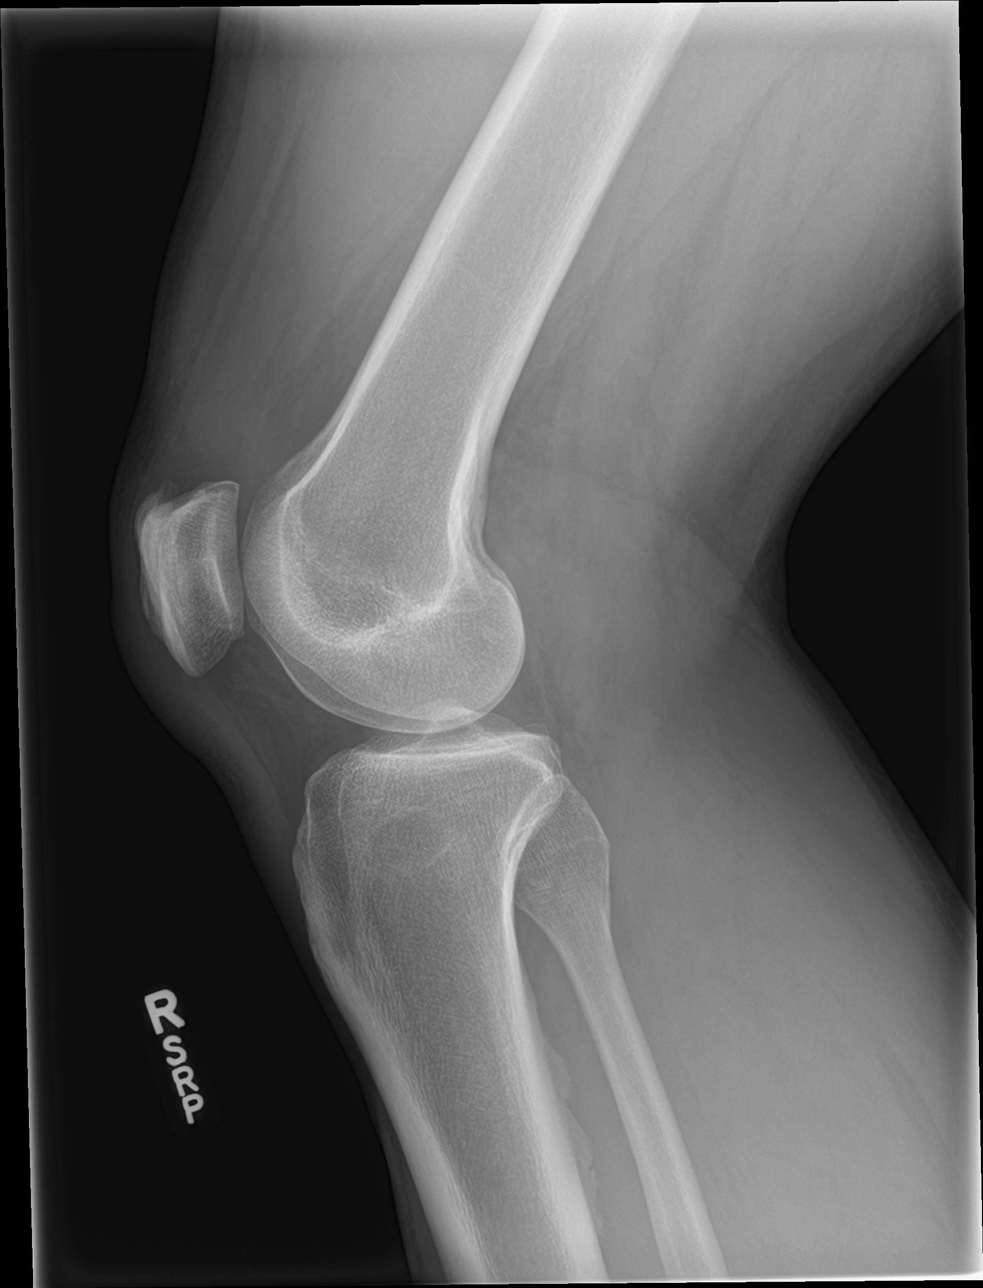

[4 of 4 positions shown; findings below may reference images not displayed]

FINDINGS: No evidence of fracture, dislocation, or joint effusion. No evidence
of arthropathy or other focal bone abnormality. Soft tissues are
unremarkable.
IMPRESSION: Negative exam.

## 2022-07-31 ENCOUNTER — Other Ambulatory Visit (HOSPITAL_COMMUNITY): Payer: Self-pay | Admitting: Medical

## 2022-07-31 DIAGNOSIS — M79606 Pain in leg, unspecified: Secondary | ICD-10-CM

## 2022-08-01 ENCOUNTER — Ambulatory Visit (HOSPITAL_COMMUNITY)
Admission: RE | Admit: 2022-08-01 | Discharge: 2022-08-01 | Disposition: A | Discharge status: Home / Self Care | Payer: 59 | Source: Ambulatory Visit | Attending: Cardiovascular Disease | Admitting: Cardiovascular Disease

## 2022-08-01 DIAGNOSIS — M79604 Pain in right leg: Secondary | ICD-10-CM

## 2022-08-01 DIAGNOSIS — M79606 Pain in leg, unspecified: Secondary | ICD-10-CM | POA: Diagnosis present
